# Patient Record
Sex: Female | Born: 1950 | Race: Black or African American | Hispanic: No | Smoking: Former smoker
Health system: Southern US, Community
[De-identification: ages and names within clinical notes are randomized; demographics above are authoritative.]

## PROBLEM LIST (undated history)

## (undated) DIAGNOSIS — M1990 Unspecified osteoarthritis, unspecified site: Secondary | ICD-10-CM

## (undated) DIAGNOSIS — K8020 Calculus of gallbladder without cholecystitis without obstruction: Secondary | ICD-10-CM

## (undated) DIAGNOSIS — H409 Unspecified glaucoma: Secondary | ICD-10-CM

## (undated) DIAGNOSIS — D509 Iron deficiency anemia, unspecified: Secondary | ICD-10-CM

## (undated) DIAGNOSIS — K648 Other hemorrhoids: Secondary | ICD-10-CM

## (undated) DIAGNOSIS — E039 Hypothyroidism, unspecified: Secondary | ICD-10-CM

## (undated) HISTORY — DX: Unspecified glaucoma: H40.9

## (undated) HISTORY — DX: Other hemorrhoids: K64.8

## (undated) HISTORY — DX: Hypothyroidism, unspecified: E03.9

## (undated) HISTORY — DX: Iron deficiency anemia, unspecified: D50.9

## (undated) HISTORY — PX: COLONOSCOPY: SHX174

## (undated) HISTORY — DX: Unspecified osteoarthritis, unspecified site: M19.90

## (undated) HISTORY — DX: Calculus of gallbladder without cholecystitis without obstruction: K80.20

---

## 1982-09-14 HISTORY — PX: CHOLECYSTECTOMY: SHX55

## 1987-09-15 HISTORY — PX: ABDOMINAL HYSTERECTOMY: SHX81

## 1996-09-14 HISTORY — PX: BREAST EXCISIONAL BIOPSY: SUR124

## 2008-09-14 HISTORY — PX: KNEE ARTHROSCOPY: SUR90

## 2009-06-25 HISTORY — PX: CERVICAL DISCECTOMY: SHX98

## 2014-07-15 ENCOUNTER — Emergency Department (HOSPITAL_COMMUNITY): Payer: Medicare HMO

## 2014-07-15 ENCOUNTER — Encounter (HOSPITAL_COMMUNITY): Payer: Self-pay | Admitting: *Deleted

## 2014-07-15 ENCOUNTER — Emergency Department (HOSPITAL_COMMUNITY)
Admission: EM | Admit: 2014-07-15 | Discharge: 2014-07-15 | Disposition: A | Discharge status: Home / Self Care | Payer: Medicare HMO | Attending: Emergency Medicine | Admitting: Emergency Medicine

## 2014-07-15 DIAGNOSIS — Y929 Unspecified place or not applicable: Secondary | ICD-10-CM | POA: Insufficient documentation

## 2014-07-15 DIAGNOSIS — S0990XA Unspecified injury of head, initial encounter: Secondary | ICD-10-CM | POA: Insufficient documentation

## 2014-07-15 DIAGNOSIS — Z791 Long term (current) use of non-steroidal anti-inflammatories (NSAID): Secondary | ICD-10-CM | POA: Insufficient documentation

## 2014-07-15 DIAGNOSIS — Y9389 Activity, other specified: Secondary | ICD-10-CM | POA: Insufficient documentation

## 2014-07-15 DIAGNOSIS — E079 Disorder of thyroid, unspecified: Secondary | ICD-10-CM | POA: Diagnosis not present

## 2014-07-15 DIAGNOSIS — W2209XA Striking against other stationary object, initial encounter: Secondary | ICD-10-CM | POA: Insufficient documentation

## 2014-07-15 DIAGNOSIS — Z79899 Other long term (current) drug therapy: Secondary | ICD-10-CM | POA: Insufficient documentation

## 2014-07-15 DIAGNOSIS — R51 Headache: Secondary | ICD-10-CM

## 2014-07-15 DIAGNOSIS — S199XXA Unspecified injury of neck, initial encounter: Secondary | ICD-10-CM | POA: Insufficient documentation

## 2014-07-15 DIAGNOSIS — R519 Headache, unspecified: Secondary | ICD-10-CM

## 2014-07-15 LAB — I-STAT CREATININE, ED: CREATININE: 1.1 mg/dL (ref 0.50–1.10)

## 2014-07-15 MED ORDER — OXYCODONE-ACETAMINOPHEN 5-325 MG PO TABS
2.0000 | ORAL_TABLET | Freq: Once | ORAL | Status: AC
  Administered 2014-07-15: 2 via ORAL
  Filled 2014-07-15: qty 2

## 2014-07-15 MED ORDER — HYDROCODONE-ACETAMINOPHEN 5-325 MG PO TABS: 2.0000 | ORAL_TABLET | ORAL | Status: DC | PRN

## 2014-07-15 NOTE — Discharge Instructions (Signed)
Your evaluated in the ED today for your headache after an injury. There is no acute or emergent cause for your headache. If you begin to experience any fevers, changes in vision, worsening headache, numbness or weakness please return to the ED for further evaluation. Take Vicodin as needed every 4 hours for pain

## 2014-07-15 NOTE — ED Provider Notes (Signed)
CSN: [id number]     Arrival date & time 07/15/14  1327 History   First MD Initiated Contact with Patient 07/15/14 1414     Chief Complaint  Patient presents with  . Headache     (Consider location/radiation/quality/duration/timing/severity/associated sxs/prior Treatment) HPI Destiny Page is a 63 y.o. female with a history of [place] who comes in for evaluation for a headache after injury. Patient states on Friday she was washing close at the [facility] [employer] when she bent over and raised up she hit her head on the above dryer door "very hard". She reports having extreme tenderness on the top of her head where she hit it as well as tenderness to her cervical spine. She denies any loss of consciousness or vomiting. She reports she has been nauseous intermittently since then. Denies any other numbness or weakness. Patient is not on any blood thinners or anticoagulation. She is concerned that she has a brain bleed and that the hit on her head has somehow compromised her discectomy surgery. No back pain red flags  Past Medical History  Diagnosis Date  . Thyroid disease    History reviewed. No pertinent past surgical history. History reviewed. No pertinent family history. History  Substance Use Topics  . Smoking status: Never Smoker   . Smokeless tobacco: Not on file  . Alcohol Use: No   OB History    No data available     Review of Systems    Allergies  Review of patient's allergies indicates no known allergies.  Home Medications   Prior to Admission medications   Medication Sig Start Date End Date Taking? Authorizing Provider  ibuprofen (ADVIL,MOTRIN) 200 MG tablet Take 200 mg by mouth every 6 (six) hours as needed.   Yes Historical Provider, MD  levothyroxine (SYNTHROID, LEVOTHROID) 112 MCG tablet Take 112 mcg by mouth daily before breakfast.   Yes Historical Provider, MD  meloxicam (MOBIC) 15 MG tablet Take 15 mg by mouth daily.   Yes  Historical Provider, MD  Multiple Vitamins-Minerals (MULTIVITAMIN WITH MINERALS) tablet Take 1 tablet by mouth daily.   Yes Historical Provider, MD  Naproxen Sodium (ALEVE) 220 MG CAPS Take 220 mg by mouth daily.   Yes Historical Provider, MD   BP 154/79 mmHg  Pulse 62  Temp(Src) 97.5 F (36.4 C) (Oral)  Resp 16  Ht 5\' 7"  (1.702 m)  Wt 170 lb (77.111 kg)  BMI 26.62 kg/m2  SpO2 100% Physical Exam  Constitutional: She is oriented to person, place, and time. She appears well-developed and well-nourished.  HENT:  Head: Normocephalic and atraumatic.  Mouth/Throat: Oropharynx is clear and moist.  Eyes: Conjunctivae are normal. Pupils are equal, round, and reactive to light. Right eye exhibits no discharge. Left eye exhibits no discharge. No scleral icterus.  Neck: Neck supple.  Cardiovascular: Normal rate, regular rhythm and normal heart sounds.   Pulmonary/Chest: Effort normal and breath sounds normal. No respiratory distress. She has no wheezes. She has no rales.  Abdominal: Soft. There is no tenderness.  Musculoskeletal: She exhibits no tenderness.  Tenderness to palpation of lower cervical spine. Patient exhibits full active range of motion of cervical, thoracic and lumbar spine. Moves all 4 extremities without difficulty  Neurological: She is alert and oriented to person, place, and time.  Cranial Nerves II-XII grossly intact. .  No focal neurodeficits patient is able to ambulate independently without any difficulty or appreciable ataxia  Skin: Skin is warm and dry. No  rash noted.  Psychiatric: She has a normal mood and affect.  Nursing note and vitals reviewed.   ED Course  Procedures (including critical care time) Labs Review Labs Reviewed - No data to display  Imaging Review No results found.   EKG Interpretation None     Meds given in ED:  Medications  oxyCODONE-acetaminophen (PERCOCET/ROXICET) 5-325 MG per tablet 2 tablet (2 tablets Oral Given 07/15/14 1651)     Discharge Medication List as of 07/15/2014  6:54 PM    START taking these medications   Details  HYDROcodone-acetaminophen (NORCO/VICODIN) 5-325 MG per tablet Take 2 tablets by mouth every 4 (four) hours as needed for moderate pain or severe pain., Starting 07/15/2014, Until Discontinued, Print       Filed Vitals:   07/15/14 1354 07/15/14 1900  BP: 154/79 122/83  Pulse: 62   Temp: 97.5 F (36.4 C)   TempSrc: Oral   Resp: 16 12  Height: 5\' 7"  (1.702 m)   Weight: 170 lb (77.111 kg)   SpO2: 100% 100%    MDM  Vitals stable - WNL -afebrile Pt resting comfortably in ED. pain managed in ED PE not concerning for any other acute or emergent pathology. No focal neurodeficits. Patient can ambulate independently without any apparent difficulty or ataxia Headache due to injury sustained not CNS, intracranial hemorrhage or other emergent cause of HA Labwork noncontributory Imaging--CT head and neck showed no acute intracranial abnormality Will DC with Vicodin for pain  Discussed f/u with PCP and return precautions, pt very amenable to plan.  Prior to patient discharge, I discussed and reviewed this case with Dr.Harrison    Final diagnoses:  Nonintractable headache, unspecified chronicity pattern, unspecified headache type        Destiny MottsBenjamin W Page Arrambide, PA-C 07/16/14 1118

## 2014-07-15 NOTE — ED Notes (Signed)
To ED for eval of head pain since hitting head on dryer door Friday. Pt denies vomiting. States she is nauseated. Ambulatory without difficulty. Denies LOC at time of incident. Taking Advil for 'head soreness'. Pt currently eating and drinking in triage. GCS 15. No neuro deficits noted. Pupils equal and reactive. Speech clear. Pt is not on blood thinners.

## 2014-07-15 NOTE — ED Notes (Signed)
Pt to CT at this time.

## 2015-02-28 ENCOUNTER — Other Ambulatory Visit: Payer: Self-pay

## 2015-02-28 DIAGNOSIS — Z1231 Encounter for screening mammogram for malignant neoplasm of breast: Secondary | ICD-10-CM

## 2015-03-08 ENCOUNTER — Ambulatory Visit
Admission: RE | Admit: 2015-03-08 | Discharge: 2015-03-08 | Disposition: A | Discharge status: Home / Self Care | Payer: Medicare HMO | Source: Ambulatory Visit

## 2015-03-08 DIAGNOSIS — Z1231 Encounter for screening mammogram for malignant neoplasm of breast: Secondary | ICD-10-CM

## 2015-03-14 ENCOUNTER — Encounter: Payer: Self-pay | Admitting: Internal Medicine

## 2015-05-21 ENCOUNTER — Encounter: Payer: Self-pay | Admitting: Internal Medicine

## 2015-05-21 ENCOUNTER — Other Ambulatory Visit (INDEPENDENT_AMBULATORY_CARE_PROVIDER_SITE_OTHER): Payer: Medicare HMO

## 2015-05-21 ENCOUNTER — Ambulatory Visit (INDEPENDENT_AMBULATORY_CARE_PROVIDER_SITE_OTHER): Payer: Medicare HMO | Admitting: Internal Medicine

## 2015-05-21 VITALS — BP 150/90 | HR 88 | Ht 64.17 in | Wt 173.5 lb

## 2015-05-21 DIAGNOSIS — K5900 Constipation, unspecified: Secondary | ICD-10-CM | POA: Diagnosis not present

## 2015-05-21 DIAGNOSIS — R140 Abdominal distension (gaseous): Secondary | ICD-10-CM | POA: Diagnosis not present

## 2015-05-21 DIAGNOSIS — D509 Iron deficiency anemia, unspecified: Secondary | ICD-10-CM

## 2015-05-21 DIAGNOSIS — R1013 Epigastric pain: Secondary | ICD-10-CM | POA: Diagnosis not present

## 2015-05-21 DIAGNOSIS — Z1211 Encounter for screening for malignant neoplasm of colon: Secondary | ICD-10-CM

## 2015-05-21 LAB — CBC WITH DIFFERENTIAL/PLATELET
BASOS ABS: 0 10*3/uL (ref 0.0–0.1)
Basophils Relative: 0.4 % (ref 0.0–3.0)
Eosinophils Absolute: 0.2 10*3/uL (ref 0.0–0.7)
Eosinophils Relative: 3.2 % (ref 0.0–5.0)
HEMATOCRIT: 36.2 % (ref 36.0–46.0)
HEMOGLOBIN: 11.4 g/dL — AB (ref 12.0–15.0)
LYMPHS PCT: 37 % (ref 12.0–46.0)
Lymphs Abs: 2.5 10*3/uL (ref 0.7–4.0)
MCHC: 31.5 g/dL (ref 30.0–36.0)
MCV: 71 fl — ABNORMAL LOW (ref 78.0–100.0)
MONO ABS: 0.6 10*3/uL (ref 0.1–1.0)
Monocytes Relative: 8.7 % (ref 3.0–12.0)
Neutro Abs: 3.5 10*3/uL (ref 1.4–7.7)
Neutrophils Relative %: 50.7 % (ref 43.0–77.0)
Platelets: 385 10*3/uL (ref 150.0–400.0)
RBC: 5.09 Mil/uL (ref 3.87–5.11)
RDW: 16.7 % — AB (ref 11.5–15.5)
WBC: 6.8 10*3/uL (ref 4.0–10.5)

## 2015-05-21 LAB — H. PYLORI ANTIBODY, IGG: H PYLORI IGG: NEGATIVE

## 2015-05-21 LAB — FERRITIN: Ferritin: 151.6 ng/mL (ref 10.0–291.0)

## 2015-05-21 LAB — IBC PANEL
Iron: 75 ug/dL (ref 42–145)
SATURATION RATIOS: 21 % (ref 20.0–50.0)
TRANSFERRIN: 255 mg/dL (ref 212.0–360.0)

## 2015-05-21 MED ORDER — INTEGRA PLUS PO CAPS: 1.0000 | ORAL_CAPSULE | Freq: Every day | ORAL | Status: DC

## 2015-05-21 MED ORDER — POLYETHYLENE GLYCOL 3350 17 GM/SCOOP PO POWD: 17.0000 g | Freq: Every day | ORAL | Status: DC

## 2015-05-21 NOTE — Progress Notes (Signed)
Patient ID: Symphanie Cederberg, female   DOB: 1951/02/21, 64 y.o.   MRN: [id number] HPI: Shawntrice Salle is a 64 year old female with past medical history of long-standing iron deficiency anemia, gallstones status post cholecystectomy, hypothyroidism who is seen in consultation at the request of Harrietta Guardian, FNP to consider repeat colonoscopy. She is here alone today. She reports overall she feels well. She's had 2 previous colonoscopies performed in [place] and was told she was due repeat colonoscopy in 2015. She moved to [place] upon retirement in 2015. From a GI perspective she reports she's been dealing with constipation over the last 6-12 months. She has been using laxative on occasion, specifically Dulcolax maybe once per month. When she has not had a bowel movement she feels bloating and fullness. She has also noted burning in her mid abdomen not necessarily related to eating. This seems to get better if she drinks hot tea. She denies heartburn, dysphagia or odynophagia. She has recently restarted ferrous sulfate and has noticed the abdominal burning since this time. She's having a bowel movement about every other day but would like to have a bowel movement daily. She denies family history of colon cancer, IBD and sickle cell. She reports her iron deficiency anemia dates back as long she can remember and she has had to periodically take iron throughout her life. She was initially referred to GI to evaluate iron deficiency anemia. She brings a copy of both colonoscopy reports today.  Past Medical History  Diagnosis Date  . Hypothyroidism   . Gallstones   . Anemia   . Arthritis     Past Surgical History  Procedure Laterality Date  . Cholecystectomy  1984  . Abdominal hysterectomy  1989  . Knee arthroscopy Bilateral 2010    Feb and April 2010  . Cervical discectomy  Jun 25, 2009    Outpatient Prescriptions Prior to Visit  Medication Sig Dispense Refill  . Multiple Vitamins-Minerals  (MULTIVITAMIN WITH MINERALS) tablet Take 1 tablet by mouth daily.    . Naproxen Sodium (ALEVE) 220 MG CAPS Take 220 mg by mouth as needed.     Marland Kitchen HYDROcodone-acetaminophen (NORCO/VICODIN) 5-325 MG per tablet Take 2 tablets by mouth every 4 (four) hours as needed for moderate pain or severe pain. 10 tablet 0  . ibuprofen (ADVIL,MOTRIN) 200 MG tablet Take 200 mg by mouth every 6 (six) hours as needed.    Marland Kitchen levothyroxine (SYNTHROID, LEVOTHROID) 112 MCG tablet Take 112 mcg by mouth daily before breakfast.    . meloxicam (MOBIC) 15 MG tablet Take 15 mg by mouth daily.     No facility-administered medications prior to visit.    No Known Allergies  Family History  Problem Relation Age of Onset  . Hypertension Mother   . Hypertension Father   . Hypertension Brother     X 3  . Hypertension Sister     x 3  . Stroke Mother   . Stroke Sister     x 2  . Stroke Father   . Stroke Brother   . Diabetes Father   . Diabetes Brother   . Diabetes Sister   . Throat cancer Maternal Aunt     Social History  Substance Use Topics  . Smoking status: Former Smoker -- 36 years    Types: Cigarettes    Quit date: 09/14/2008  . Smokeless tobacco: Never Used  . Alcohol Use: No    ROS: As per history of present illness, otherwise negative  BP 150/90  mmHg  Pulse 88  Ht 5' 4.17" (1.63 m)  Wt 173 lb 8 oz (78.699 kg)  BMI 29.62 kg/m2 Constitutional: Well-developed and well-nourished. No distress. HEENT: Normocephalic and atraumatic. Oropharynx is clear and moist. No oropharyngeal exudate. Conjunctivae are normal.  No scleral icterus. Neck: Neck supple. Trachea midline. Cardiovascular: Normal rate, regular rhythm and intact distal pulses. No M/R/G Pulmonary/chest: Effort normal and breath sounds normal. No wheezing, rales or rhonchi. Abdominal: Soft, nontender, mildly distended. Bowel sounds active throughout. Extremities: no clubbing, cyanosis, or edema Lymphadenopathy: No cervical adenopathy  noted. Neurological: Alert and oriented to person place and time. Skin: Skin is warm and dry. No rashes noted. Psychiatric: Normal mood and affect. Behavior is normal.  RELEVANT LABS AND IMAGING: CBC    Component Value Date/Time   WBC 6.8 05/21/2015 1535   RBC 5.09 05/21/2015 1535   HGB 11.4* 05/21/2015 1535   HCT 36.2 05/21/2015 1535   PLT 385.0 05/21/2015 1535   MCV 71.0* 05/21/2015 1535   MCHC 31.5 05/21/2015 1535   RDW 16.7* 05/21/2015 1535   LYMPHSABS 2.5 05/21/2015 1535   MONOABS 0.6 05/21/2015 1535   EOSABS 0.2 05/21/2015 1535   BASOSABS 0.0 05/21/2015 1535   Iron/TIBC/Ferritin/ %Sat    Component Value Date/Time   IRON 75 05/21/2015 1535   FERRITIN 151.6 05/21/2015 1535   IRONPCTSAT 21.0 05/21/2015 1535   GI records reviewed and discussed with the patient Colonoscopy dated 04/19/2002 - performed by Dr. Enzo Montgomery -- internal hemorrhoids, normal colon. Visualization good. Exam to the cecum Colonoscopy dated 05/21/2009 -- performed by Dr. Enzo Montgomery -- internal hemorrhoids, no evidence of colonic polyp or neoplasm   ASSESSMENT/PLAN:  64 year old female with past medical history of long-standing iron deficiency anemia, gallstones status post cholecystectomy, hypothyroidism who is seen in consultation at the request of Harrietta Guardian, FNP to consider repeat colonoscopy.   1. CRC screening -- she is average risk and we discussed current nationally colon cancer screening guidelines. She has had 2 normal colonoscopies to the cecum last was in 2010. Current guidelines support repeat screening at 10 year intervals. This is news to her. I have recommended repeat colonoscopy in September 2020  2. History of iron deficiency anemia -- repeat CBC and iron studies today. She has started ferrous sulfate and noted constipation and abdominal burning. This may be secondary to her iron supplement. I recommended changing iron supplement to Integra 1 tablet daily rather than ferrous  sulfate. Iron studies show normal iron levels today though she does have a mild microcytic anemia. I wonder if she has a thalassemia variant because iron studies are not low today but she remains microcytic. Consider hemoglobin electrophoresis at follow-up  3. Constipation -- mild and associated with bloating. Trial of MiraLAX 17 g daily  4. Abdominal burning pain -- mild possibly related to iron. Check H. pylori antibody. Treat if positive.  Follow-up in 3 months to reassess symptoms. If abdominal discomfort continues consider upper endoscopy versus empiric trial of PPI    ZO:XWRUE Darleen Crocker, Fnp [address] [place], [place] [address]

## 2015-05-21 NOTE — Patient Instructions (Signed)
We have sent the following medications to your pharmacy for you to pick up at your convenience: Integra 1 capsule daily (in place of over the counter ferrous sulfate) Miralax 17 grams-Dissolved in at least 8 ounces water/juice once daily  Your physician has requested that you go to the basement for the following lab work before leaving today: CBC, IBC, Ferritin, H pylori antibody  Please discontinue stool softeners.  You have been scheduled for a follow up appointment with Dr Rhea Belton on Monday, 07/29/15 @ 1:30 pm.  CC:Dr Fredric Mare

## 2015-05-22 ENCOUNTER — Other Ambulatory Visit: Payer: Self-pay

## 2015-05-22 DIAGNOSIS — D509 Iron deficiency anemia, unspecified: Secondary | ICD-10-CM

## 2015-06-02 ENCOUNTER — Encounter (HOSPITAL_COMMUNITY): Payer: Self-pay | Admitting: *Deleted

## 2015-06-02 ENCOUNTER — Emergency Department (HOSPITAL_COMMUNITY): Payer: Medicare HMO

## 2015-06-02 ENCOUNTER — Emergency Department (HOSPITAL_COMMUNITY)
Admission: EM | Admit: 2015-06-02 | Discharge: 2015-06-02 | Disposition: A | Discharge status: Home / Self Care | Payer: Medicare HMO | Attending: Emergency Medicine | Admitting: Emergency Medicine

## 2015-06-02 DIAGNOSIS — Z87891 Personal history of nicotine dependence: Secondary | ICD-10-CM | POA: Diagnosis not present

## 2015-06-02 DIAGNOSIS — E039 Hypothyroidism, unspecified: Secondary | ICD-10-CM | POA: Insufficient documentation

## 2015-06-02 DIAGNOSIS — M79671 Pain in right foot: Secondary | ICD-10-CM | POA: Insufficient documentation

## 2015-06-02 DIAGNOSIS — M1990 Unspecified osteoarthritis, unspecified site: Secondary | ICD-10-CM | POA: Insufficient documentation

## 2015-06-02 DIAGNOSIS — Z8719 Personal history of other diseases of the digestive system: Secondary | ICD-10-CM | POA: Diagnosis not present

## 2015-06-02 DIAGNOSIS — D649 Anemia, unspecified: Secondary | ICD-10-CM | POA: Diagnosis not present

## 2015-06-02 DIAGNOSIS — Z791 Long term (current) use of non-steroidal anti-inflammatories (NSAID): Secondary | ICD-10-CM | POA: Insufficient documentation

## 2015-06-02 DIAGNOSIS — M25474 Effusion, right foot: Secondary | ICD-10-CM | POA: Diagnosis not present

## 2015-06-02 DIAGNOSIS — Z79899 Other long term (current) drug therapy: Secondary | ICD-10-CM | POA: Insufficient documentation

## 2015-06-02 MED ORDER — TRAMADOL HCL 50 MG PO TABS
50.0000 mg | ORAL_TABLET | Freq: Once | ORAL | Status: AC
  Administered 2015-06-02: 50 mg via ORAL
  Filled 2015-06-02: qty 1

## 2015-06-02 MED ORDER — TRAMADOL HCL 50 MG PO TABS: 50.0000 mg | ORAL_TABLET | Freq: Four times a day (QID) | ORAL | Status: DC | PRN

## 2015-06-02 NOTE — ED Notes (Signed)
Declined W/C at D/C and was escorted to lobby by RN. 

## 2015-06-02 NOTE — ED Notes (Signed)
Pt reports RT foot pain stared one week ago 05-26-15 . Unknown cause of pain. Pt denies any injury. Pt reports OTC not helping .

## 2015-06-02 NOTE — ED Provider Notes (Signed)
CSN: [id number]     Arrival date & time 06/02/15  0910 History  This chart was scribed for Renne Crigler, PA-C, working with Marily Memos, MD by Elon Spanner, ED Scribe. This patient was seen in room TR07C/TR07C and the patient's care was started at 9:56 AM.   Chief Complaint  Patient presents with  . Foot Pain   The history is provided by the patient. No language interpreter was used.   HPI Comments: Destiny Page is a 64 y.o. female who presents to the Emergency Department complaining of moderate, gradually worsening right foot pain and swelling onset 1 week ago, worse with use, improved with rest, not improved by NSAID/epsom salt/OTC topical cream.  The pain onset after wearing heels to church.  She denies hx of arthritis including gout. She denies knee pain, calf pain.    Past Medical History  Diagnosis Date  . Hypothyroidism   . Gallstones   . Anemia   . Arthritis    Past Surgical History  Procedure Laterality Date  . Cholecystectomy  1984  . Abdominal hysterectomy  1989  . Knee arthroscopy Bilateral 2010    Feb and April 2010  . Cervical discectomy  Jun 25, 2009   Family History  Problem Relation Age of Onset  . Hypertension Mother   . Hypertension Father   . Hypertension Brother     X 3  . Hypertension Sister     x 3  . Stroke Mother   . Stroke Sister     x 2  . Stroke Father   . Stroke Brother   . Diabetes Father   . Diabetes Brother   . Diabetes Sister   . Throat cancer Maternal Aunt    Social History  Substance Use Topics  . Smoking status: Former Smoker -- 36 years    Types: Cigarettes    Quit date: 09/14/2008  . Smokeless tobacco: Never Used  . Alcohol Use: No   OB History    No data available     Review of Systems  Constitutional: Negative for fever and activity change.  Musculoskeletal: Positive for joint swelling, arthralgias and gait problem. Negative for back pain and neck pain.  Skin: Negative for wound.  Neurological: Negative for  weakness and numbness.      Allergies  Review of patient's allergies indicates no known allergies.  Home Medications   Prior to Admission medications   Medication Sig Start Date End Date Taking? Authorizing Provider  cyclobenzaprine (FLEXERIL) 10 MG tablet Take 10 mg by mouth as needed for muscle spasms.    Historical Provider, MD  diclofenac (VOLTAREN) 75 MG EC tablet Take 75 mg by mouth 2 (two) times daily.    Historical Provider, MD  FeFum-FePoly-FA-B Cmp-C-Biot (INTEGRA PLUS) CAPS Take 1 capsule by mouth daily. 05/21/15   Beverley Fiedler, MD  levothyroxine (SYNTHROID, LEVOTHROID) 125 MCG tablet Take 125 mcg by mouth daily before breakfast.    Historical Provider, MD  Multiple Vitamins-Minerals (MULTIVITAMIN WITH MINERALS) tablet Take 1 tablet by mouth daily.    Historical Provider, MD  Naproxen Sodium (ALEVE) 220 MG CAPS Take 220 mg by mouth as needed.     Historical Provider, MD  polyethylene glycol powder (GLYCOLAX/MIRALAX) powder Take 17 g by mouth daily. 05/21/15   Beverley Fiedler, MD   There were no vitals taken for this visit. Physical Exam  Constitutional: She is oriented to person, place, and time. She appears well-developed and well-nourished. No distress.  HENT:  Head: Normocephalic and atraumatic.  Eyes: Conjunctivae and EOM are normal. Pupils are equal, round, and reactive to light.  Neck: Normal range of motion. Neck supple. No tracheal deviation present.  Cardiovascular: Normal rate.  Exam reveals no decreased pulses.   Pulses:      Dorsalis pedis pulses are 2+ on the right side.       Posterior tibial pulses are 2+ on the right side.  Pulmonary/Chest: Effort normal. No respiratory distress.  Musculoskeletal: She exhibits tenderness. She exhibits no edema.       Right ankle: Normal.       Right foot: There is decreased range of motion (with inversion/eversion), tenderness and bony tenderness. There is no swelling.  Neurological: She is alert and oriented to person, place,  and time. No sensory deficit.  Motor, sensation, and vascular distal to the injury is fully intact.   Skin: Skin is warm and dry.  Psychiatric: She has a normal mood and affect. Her behavior is normal.  Nursing note and vitals reviewed.   ED Course  Procedures (including critical care time)  DIAGNOSTIC STUDIES: Oxygen Saturation is 99% on RA, normal by my interpretation.    COORDINATION OF CARE:  9:58 AM Will order imaging of right foot.  Patient should f/u with provided orthopaedic referral.  Patient acknowledges and agrees with plan.   Labs Review Labs Reviewed - No data to display  Imaging Review Dg Foot Complete Right  06/02/2015   CLINICAL DATA:  Right foot pain-top of her right foot x 1 week. Pt denies any injury.  EXAM: RIGHT FOOT COMPLETE - 3+ VIEW  COMPARISON:  None.  FINDINGS: There is no evidence of fracture or dislocation. Small calcaneal spurs. There is no evidence of arthropathy or other focal bone abnormality. Soft tissues are unremarkable.  IMPRESSION: Negative.   Electronically Signed   By: Corlis Leak M.D.   On: 06/02/2015 10:48   I have personally reviewed and evaluated these images and lab results as part of my medical decision-making.   EKG Interpretation None       Vital signs reviewed and are as follows: Filed Vitals:   06/02/15 0942  BP: 116/68  Pulse: 87  Temp: 98 F (36.7 C)  Resp: 18   Patient informed of negative x-ray results. She will continue her home NSAIDs. She has a cane at home which she will use for stability purposes. Orthopedic referral given and encouraged if she continues to have significant pain in one week. Tramadol temporarily for pain.  Patient counseled on use of narcotic pain medications. Counseled not to combine these medications with others containing tylenol. Urged not to drink alcohol, drive, or perform any other activities that requires focus while taking these medications. The patient verbalizes understanding and agrees with  the plan.   MDM   Final diagnoses:  Foot pain, right    Patient with foot pain, unclear etiology. Imaging is negative. Suspect arthritis. Treatment as above. Foot is neurovascularly intact. Orthopedic referral given.  I personally performed the services described in this documentation, which was scribed in my presence. The recorded information has been reviewed and is accurate.    Renne Crigler, PA-C 06/02/15 1107  Marily Memos, MD 06/02/15 [phone]

## 2015-06-02 NOTE — Discharge Instructions (Signed)
Please read and follow all provided instructions.  Your diagnoses today include:  1. Foot pain, right     Tests performed today include:  An x-ray of the affected area - does NOT show any broken bones  Vital signs. See below for your results today.   Medications prescribed:   Tramadol - narcotic-like pain medication  DO NOT drive or perform any activities that require you to be awake and alert because this medicine can make you drowsy.   Take any prescribed medications only as directed.  Home care instructions:   Follow any educational materials contained in this packet  Follow R.I.C.E. Protocol:  R - rest your injury   I  - use ice on injury without applying directly to skin  C - compress injury with bandage or splint  E - elevate the injury as much as possible  Follow-up instructions: Please follow-up with your primary care provider or the provided orthopedic physician (bone specialist) if you continue to have significant pain in 1 week. In this case you may have a more severe injury that requires further care.   Return instructions:   Please return if your toes or feet are numb or tingling, appear gray or blue, or you have severe pain (also elevate the leg and loosen splint or wrap if you were given one)  Please return to the Emergency Department if you experience worsening symptoms.   Please return if you have any other emergent concerns.  Additional Information:  Your vital signs today were: BP 116/68 mmHg   Pulse 87   Temp(Src) 98 F (36.7 C) (Oral)   Resp 18   Ht  (1.626 m)   Wt 173 lb (78.472 kg)   BMI 29.68 kg/m2   SpO2 99% If your blood pressure (BP) was elevated above 135/85 this visit, please have this repeated by your doctor within one month. --------------

## 2015-06-02 NOTE — ED Notes (Signed)
PT refused ice to foot because she has already use ice for pain . Pt reports ice did not help pain.

## 2015-07-01 ENCOUNTER — Other Ambulatory Visit: Payer: Self-pay | Admitting: Orthopaedic Surgery

## 2015-07-01 ENCOUNTER — Other Ambulatory Visit: Payer: Medicare HMO

## 2015-07-01 ENCOUNTER — Ambulatory Visit
Admission: RE | Admit: 2015-07-01 | Discharge: 2015-07-01 | Disposition: A | Discharge status: Home / Self Care | Payer: Medicare HMO | Source: Ambulatory Visit | Attending: Orthopaedic Surgery | Admitting: Orthopaedic Surgery

## 2015-07-01 DIAGNOSIS — M79671 Pain in right foot: Secondary | ICD-10-CM

## 2015-07-09 ENCOUNTER — Encounter: Payer: Self-pay | Admitting: *Deleted

## 2015-07-29 ENCOUNTER — Ambulatory Visit: Payer: Medicare HMO | Admitting: Internal Medicine

## 2015-09-30 ENCOUNTER — Other Ambulatory Visit (INDEPENDENT_AMBULATORY_CARE_PROVIDER_SITE_OTHER): Payer: Medicare HMO

## 2015-09-30 ENCOUNTER — Ambulatory Visit (INDEPENDENT_AMBULATORY_CARE_PROVIDER_SITE_OTHER): Payer: Medicare HMO | Admitting: Internal Medicine

## 2015-09-30 ENCOUNTER — Encounter: Payer: Self-pay | Admitting: Internal Medicine

## 2015-09-30 VITALS — BP 128/72 | HR 76 | Ht 64.0 in | Wt 172.0 lb

## 2015-09-30 DIAGNOSIS — Z1211 Encounter for screening for malignant neoplasm of colon: Secondary | ICD-10-CM | POA: Diagnosis not present

## 2015-09-30 DIAGNOSIS — R143 Flatulence: Secondary | ICD-10-CM | POA: Diagnosis not present

## 2015-09-30 DIAGNOSIS — K5900 Constipation, unspecified: Secondary | ICD-10-CM | POA: Diagnosis not present

## 2015-09-30 DIAGNOSIS — D509 Iron deficiency anemia, unspecified: Secondary | ICD-10-CM | POA: Diagnosis not present

## 2015-09-30 DIAGNOSIS — K5909 Other constipation: Secondary | ICD-10-CM

## 2015-09-30 LAB — CBC WITH DIFFERENTIAL/PLATELET
BASOS ABS: 0 10*3/uL (ref 0.0–0.1)
Basophils Relative: 0.4 % (ref 0.0–3.0)
Eosinophils Absolute: 0.2 10*3/uL (ref 0.0–0.7)
Eosinophils Relative: 2.7 % (ref 0.0–5.0)
HEMATOCRIT: 33.4 % — AB (ref 36.0–46.0)
Hemoglobin: 10.5 g/dL — ABNORMAL LOW (ref 12.0–15.0)
Lymphocytes Relative: 40.7 % (ref 12.0–46.0)
Lymphs Abs: 2.7 10*3/uL (ref 0.7–4.0)
MCHC: 31.6 g/dL (ref 30.0–36.0)
MCV: 70.2 fl — AB (ref 78.0–100.0)
Monocytes Absolute: 0.5 10*3/uL (ref 0.1–1.0)
Monocytes Relative: 8.4 % (ref 3.0–12.0)
NEUTROS ABS: 3.1 10*3/uL (ref 1.4–7.7)
Neutrophils Relative %: 47.8 % (ref 43.0–77.0)
Platelets: 355 10*3/uL (ref 150.0–400.0)
RBC: 4.75 Mil/uL (ref 3.87–5.11)
RDW: 15.9 % — ABNORMAL HIGH (ref 11.5–15.5)
WBC: 6.6 10*3/uL (ref 4.0–10.5)

## 2015-09-30 LAB — IBC PANEL
Iron: 57 ug/dL (ref 42–145)
SATURATION RATIOS: 16.5 % — AB (ref 20.0–50.0)
Transferrin: 247 mg/dL (ref 212.0–360.0)

## 2015-09-30 MED ORDER — POLYSACCHARIDE IRON COMPLEX 150 MG PO CAPS: 150.0000 mg | ORAL_CAPSULE | Freq: Two times a day (BID) | ORAL | Status: AC

## 2015-09-30 MED ORDER — POLYETHYLENE GLYCOL 3350 17 GM/SCOOP PO POWD: 17.0000 g | Freq: Every day | ORAL | Status: AC

## 2015-09-30 NOTE — Progress Notes (Signed)
Subjective:    Patient ID: Destiny Page, female    DOB: 01/04/1951, 65 y.o.   MRN: [id number]  HPI Destiny Page is a  65 year old female with long-standing history of iron deficiency anemia, history of chronic constipation , hypothyroidism who is here for follow-up. She was initially seen on 05/21/2015. At that time we discussed her iron deficiency anemia as well as constipation. 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 She's having 1-2 bowel movements per day hich are soft and formed. She is very happy with the result. She's had less abdominal bloating and lower discomfort with more regular bowel movements. She does report frequent gas/ flatulence when having bowel movements or even urinating. This is somewhat bothersome to her. She denies abdominal pain, nausea or vomiting. Denies dysphagia or odynophagia. She has taken Integra for iron supplementation and tolerated this better than ferrous sulfate. However it is expensive and she wonders if there is another option. No fevers or chills. She is planning on 10/16/2015 to drop beef and pork from her diet in order to be more healthy. She is worried about "problems inside my colon" and is interested in repeat colorectal cancer screening.   Review of Systems  as per history of present illness, otherwise negative  Current Medications, Allergies, Past Medical History, Past Surgical History, Family History and Social History were reviewed in [facility] electronic medical record.     Objective:   Physical Exam BP 128/72 mmHg  Pulse 76  Ht 5\' 4"  (1.626 m)  Wt 172 lb (78.019 kg)  BMI 29.51 kg/m2 Constitutional: Well-developed and well-nourished. No distress. HEENT: Normocephalic and atraumatic.Marland Kitchen. Conjunctivae are normal.  No scleral icterus. Neck: Neck supple. Trachea midline. Cardiovascular: Normal rate, regular rhythm and intact distal pulses. No M/R/G Pulmonary/chest: Effort  normal and breath sounds normal. No wheezing, rales or rhonchi. Abdominal: Soft, nontender, nondistended. Bowel sounds active throughout. Extremities: no clubbing, cyanosis, or edema Neurological: Alert and oriented to person place and time. Skin: Skin is warm and dry.  Psychiatric: Normal mood and affect. Behavior is normal.  Colonoscopy dated 04/19/2002 - performed by Dr. Enzo Montgomeryobert Harooni -- internal hemorrhoids, normal colon. Visualization good. Exam to the cecum Colonoscopy dated 05/21/2009 -- performed by Dr. Enzo Montgomeryobert Harooni -- internal hemorrhoids, no evidence of colonic polyp or neoplasm  CBC    Component Value Date/Time   WBC 6.8 05/21/2015 1535   RBC 5.09 05/21/2015 1535   HGB 11.4* 05/21/2015 1535   HCT 36.2 05/21/2015 1535   PLT 385.0 05/21/2015 1535   MCV 71.0* 05/21/2015 1535   MCHC 31.5 05/21/2015 1535   RDW 16.7* 05/21/2015 1535   LYMPHSABS 2.5 05/21/2015 1535   MONOABS 0.6 05/21/2015 1535   EOSABS 0.2 05/21/2015 1535   BASOSABS 0.0 05/21/2015 1535   Iron/TIBC/Ferritin/ %Sat    Component Value Date/Time   IRON 75 05/21/2015 1535   FERRITIN 151.6 05/21/2015 1535   IRONPCTSAT 21.0 05/21/2015 1535      Assessment & Plan:   65 year old female with long-standing history of iron deficiency anemia, history of chronic constipation , hypothyroidism who is here for follow-up.   1.  IDA --  She's been on Integra and iron studies were within normal limits when checked in September. Recheck today for iron, CBC and also hemoglobin electrophoresis. We'll try to find another more for double iron preparation for her.   2. Chronic constipation --  Improved with MiraLAX. Continue 17 g daily   3.  Flatulence --  Possibly worsened by MiraLAX. It has helped problem #2 and so I recommend continuing MiraLAX daily. Trial of align 1 capsule daily as a probiotic to see if this improves flatulence. Despite flatulence, bloating improved with treatment for constipation   4. CRC screening --   She is very much in favor of repeat colorectal cancer screening at this time. Her prior GI doctor recommended every 5 year screening interval. She is average risk without family history of colon cancer. On 2 colonoscopy she's had no evidence for colonic polyp. I discussed current guidelines for 10 year surveillance but this makes her very uncomfortable. Her last exam was nearly 7 years ago. After discussion of the risks, benefit, alternative and her preference, we will repeat colorectal cancer screening at this time.  1 yr follow-up, sooner if needed

## 2015-09-30 NOTE — Patient Instructions (Signed)
You have been scheduled for a colonoscopy. Please follow written instructions given to you at your visit today.  Please pick up your prep supplies at the pharmacy within the next 1-3 days. If you use inhalers (even only as needed), please bring them with you on the day of your procedure. Your physician has requested that you go to www.startemmi.com and enter the access code given to you at your visit today. This web site gives a general overview about your procedure. However, you should still follow specific instructions given to you by our office regarding your preparation for the procedure.  We have sent the following medications to your pharmacy for you to pick up at your convenience: Nu Iron 150 mg twice daily Miralax 17 grams daily  Please follow up with Dr Rhea BeltonPyrtle in 6 months.  Please purchase the following medications over the counter and take as directed: Align- Take 1 capsule once daily

## 2015-10-02 ENCOUNTER — Other Ambulatory Visit: Payer: Self-pay

## 2015-10-02 DIAGNOSIS — D509 Iron deficiency anemia, unspecified: Secondary | ICD-10-CM

## 2015-10-02 LAB — HEMOGLOBINOPATHY EVALUATION
Hemoglobin Other: 0 %
Hgb A2 Quant: 2.4 % (ref 2.2–3.2)
Hgb A: 97.6 % (ref 96.8–97.8)
Hgb F Quant: 0 % (ref 0.0–2.0)
Hgb S Quant: 0 %

## 2015-10-02 MED ORDER — NA SULFATE-K SULFATE-MG SULF 17.5-3.13-1.6 GM/177ML PO SOLN: ORAL | Status: DC

## 2015-11-14 ENCOUNTER — Ambulatory Visit (AMBULATORY_SURGERY_CENTER): Payer: Medicare HMO | Admitting: Internal Medicine

## 2015-11-14 ENCOUNTER — Encounter: Payer: Self-pay | Admitting: Internal Medicine

## 2015-11-14 VITALS — BP 146/81 | HR 79 | Temp 97.6°F | Resp 16 | Ht 64.0 in | Wt 172.0 lb

## 2015-11-14 DIAGNOSIS — D509 Iron deficiency anemia, unspecified: Secondary | ICD-10-CM

## 2015-11-14 DIAGNOSIS — Z1211 Encounter for screening for malignant neoplasm of colon: Secondary | ICD-10-CM

## 2015-11-14 DIAGNOSIS — Z862 Personal history of diseases of the blood and blood-forming organs and certain disorders involving the immune mechanism: Secondary | ICD-10-CM

## 2015-11-14 MED ORDER — SODIUM CHLORIDE 0.9 % IV SOLN: 500.0000 mL | INTRAVENOUS | Status: DC

## 2015-11-14 NOTE — Op Note (Signed)
[facility] [address] [place], [address]   COLONOSCOPY PROCEDURE REPORT  PATIENT: Emalyn, Schou  MR#: [id number] BIRTHDATE: 1951/06/18 , 64  yrs. old GENDER: female ENDOSCOPIST: Beverley Fiedler, MD PROCEDURE DATE:  11/14/2015 PROCEDURE:   Colonoscopy, screening First Screening Colonoscopy - Avg.  risk and is 50 yrs.  old or older - No.  Prior Negative Screening - Now for repeat screening. Less than 10 yrs Prior Negative Screening - Now for repeat screening.  Other: See Comments  History of Adenoma - Now for follow-up colonoscopy & has been > or = to 3 yrs.  N/A  Polyps removed today? No Recommend repeat exam, <10 yrs? No ASA CLASS:   Class II INDICATIONS:Screening for colonic neoplasia and history of IDA. MEDICATIONS: Monitored anesthesia care and Propofol 200 mg IV  DESCRIPTION OF PROCEDURE:   After the risks benefits and alternatives of the procedure were thoroughly explained, informed consent was obtained.  The digital rectal exam revealed no abnormalities of the rectum.   The LB [id number]  endoscope was introduced through the anus and advanced to the cecum, which was identified by both the appendix and ileocecal valve. No adverse events experienced.   The quality of the prep was good.  (Suprep was used)  The instrument was then slowly withdrawn as the colon was fully examined. Estimated blood loss is zero unless otherwise noted in this procedure report.      COLON FINDINGS: A normal appearing cecum, ileocecal valve, and appendiceal orifice were identified.  the ascending, transverse, descending, sigmoid colon, and rectum appeared unremarkable. Retroflexed views revealed no abnormalities. The time to cecum = 4.1 Withdrawal time = 9.4   The scope was withdrawn and the procedure completed.  COMPLICATIONS: There were no complications.  ENDOSCOPIC IMPRESSION: Normal colonoscopy  RECOMMENDATIONS: You should continue to follow colorectal  cancer screening guidelines for "routine risk" patients with a repeat colonoscopy in 10 years.   eSigned:  Beverley Fiedler, MD 11/14/2015 8:34 AM   cc: the patient, Harrietta Guardian, FNP

## 2015-11-14 NOTE — Patient Instructions (Signed)
YOU HAD AN ENDOSCOPIC PROCEDURE TODAY AT THE [facility]:   Refer to the procedure report that was given to you for any specific questions about what was found during the examination.  If the procedure report does not answer your questions, please call your gastroenterologist to clarify.  If you requested that your care partner not be given the details of your procedure findings, then the procedure report has been included in a sealed envelope for you to review at your convenience later.  YOU SHOULD EXPECT: Some feelings of bloating in the abdomen. Passage of more gas than usual.  Walking can help get rid of the air that was put into your GI tract during the procedure and reduce the bloating. If you had a lower endoscopy (such as a colonoscopy or flexible sigmoidoscopy) you may notice spotting of blood in your stool or on the toilet paper. If you underwent a bowel prep for your procedure, you may not have a normal bowel movement for a few days.  Please Note:  You might notice some irritation and congestion in your nose or some drainage.  This is from the oxygen used during your procedure.  There is no need for concern and it should clear up in a day or so.  SYMPTOMS TO REPORT IMMEDIATELY:   Following lower endoscopy (colonoscopy or flexible sigmoidoscopy):  Excessive amounts of blood in the stool  Significant tenderness or worsening of abdominal pains  Swelling of the abdomen that is new, acute  Fever of 100F or higher   For urgent or emergent issues, a gastroenterologist can be reached at any hour by calling [phone].   DIET: Your first meal following the procedure should be a small meal and then it is ok to progress to your normal diet. Heavy or fried foods are harder to digest and may make you feel nauseous or bloated.  Likewise, meals heavy in dairy and vegetables can increase bloating.  Drink plenty of fluids but you should avoid alcoholic beverages for 24  hours.  ACTIVITY:  You should plan to take it easy for the rest of today and you should NOT DRIVE or use heavy machinery until tomorrow (because of the sedation medicines used during the test).    FOLLOW UP: Our staff will call the number listed on your records the next business day following your procedure to check on you and address any questions or concerns that you may have regarding the information given to you following your procedure. If we do not reach you, we will leave a message.  However, if you are feeling well and you are not experiencing any problems, there is no need to return our call.  We will assume that you have returned to your regular daily activities without incident.  If any biopsies were taken you will be contacted by phone or by letter within the next 1-3 weeks.  Please call us at [phone] if you have not heard about the biopsies in 3 weeks.    SIGNATURES/CONFIDENTIALITY: You and/or your care partner have signed paperwork which will be entered into your electronic medical record.  These signatures attest to the fact that that the information above on your After Visit Summary has been reviewed and is understood.  Full responsibility of the confidentiality of this discharge information lies with you and/or your care-partner. 

## 2015-11-14 NOTE — Progress Notes (Signed)
Patient awakening,vss,report to rn 

## 2015-11-15 ENCOUNTER — Telehealth: Payer: Self-pay

## 2015-11-15 NOTE — Telephone Encounter (Signed)
  Follow up Call-  Call back number 11/14/2015  Post procedure Call Back phone  # #[phone] hm  Permission to leave phone message Yes     Patient questions:  Do you have a fever, pain , or abdominal swelling? No. Pain Score  0 *  Have you tolerated food without any problems? Yes.    Have you been able to return to your normal activities? Yes.    Do you have any questions about your discharge instructions: Diet   No. Medications  No. Follow up visit  No.  Do you have questions or concerns about your Care? No.  Actions: * If pain score is 4 or above: No action needed, pain <4.   Pt thanked us for the wonderful care she received yesterday. maw

## 2015-12-30 ENCOUNTER — Telehealth: Payer: Self-pay

## 2015-12-30 NOTE — Telephone Encounter (Signed)
-----   Message from Chrystie NoseLinda R Hunt, RN sent at 10/02/2015  4:24 PM EST ----- Regarding: labs Needs labs in April, order in epic.

## 2015-12-30 NOTE — Telephone Encounter (Signed)
Pt aware.

## 2015-12-31 ENCOUNTER — Other Ambulatory Visit (INDEPENDENT_AMBULATORY_CARE_PROVIDER_SITE_OTHER): Payer: Medicare HMO

## 2015-12-31 DIAGNOSIS — D509 Iron deficiency anemia, unspecified: Secondary | ICD-10-CM

## 2015-12-31 LAB — IBC PANEL
IRON: 64 ug/dL (ref 42–145)
Saturation Ratios: 17.7 % — ABNORMAL LOW (ref 20.0–50.0)
TRANSFERRIN: 259 mg/dL (ref 212.0–360.0)

## 2015-12-31 LAB — CBC WITH DIFFERENTIAL/PLATELET
BASOS ABS: 0 10*3/uL (ref 0.0–0.1)
Basophils Relative: 0.6 % (ref 0.0–3.0)
Eosinophils Absolute: 0.2 10*3/uL (ref 0.0–0.7)
Eosinophils Relative: 2.7 % (ref 0.0–5.0)
HCT: 33.8 % — ABNORMAL LOW (ref 36.0–46.0)
Hemoglobin: 10.7 g/dL — ABNORMAL LOW (ref 12.0–15.0)
LYMPHS ABS: 3.1 10*3/uL (ref 0.7–4.0)
Lymphocytes Relative: 45.6 % (ref 12.0–46.0)
MCHC: 31.7 g/dL (ref 30.0–36.0)
MCV: 70.2 fl — AB (ref 78.0–100.0)
MONOS PCT: 9.7 % (ref 3.0–12.0)
Monocytes Absolute: 0.7 10*3/uL (ref 0.1–1.0)
NEUTROS ABS: 2.8 10*3/uL (ref 1.4–7.7)
NEUTROS PCT: 41.4 % — AB (ref 43.0–77.0)
PLATELETS: 343 10*3/uL (ref 150.0–400.0)
RBC: 4.81 Mil/uL (ref 3.87–5.11)
RDW: 16.7 % — ABNORMAL HIGH (ref 11.5–15.5)
WBC: 6.7 10*3/uL (ref 4.0–10.5)

## 2015-12-31 LAB — FERRITIN: FERRITIN: 107 ng/mL (ref 10.0–291.0)

## 2016-01-02 ENCOUNTER — Other Ambulatory Visit: Payer: Self-pay

## 2016-01-02 DIAGNOSIS — D509 Iron deficiency anemia, unspecified: Secondary | ICD-10-CM

## 2016-01-31 ENCOUNTER — Other Ambulatory Visit: Payer: Self-pay

## 2016-01-31 DIAGNOSIS — Z1231 Encounter for screening mammogram for malignant neoplasm of breast: Secondary | ICD-10-CM

## 2016-03-09 ENCOUNTER — Ambulatory Visit
Admission: RE | Admit: 2016-03-09 | Discharge: 2016-03-09 | Disposition: A | Discharge status: Home / Self Care | Payer: Medicare HMO | Source: Ambulatory Visit

## 2016-03-09 DIAGNOSIS — Z1231 Encounter for screening mammogram for malignant neoplasm of breast: Secondary | ICD-10-CM

## 2016-03-10 ENCOUNTER — Other Ambulatory Visit: Payer: Self-pay | Admitting: Nurse Practitioner

## 2016-03-10 DIAGNOSIS — R928 Other abnormal and inconclusive findings on diagnostic imaging of breast: Secondary | ICD-10-CM

## 2016-03-16 ENCOUNTER — Ambulatory Visit
Admission: RE | Admit: 2016-03-16 | Discharge: 2016-03-16 | Disposition: A | Discharge status: Home / Self Care | Payer: Medicare HMO | Source: Ambulatory Visit | Attending: Nurse Practitioner | Admitting: Nurse Practitioner

## 2016-03-16 DIAGNOSIS — R928 Other abnormal and inconclusive findings on diagnostic imaging of breast: Secondary | ICD-10-CM

## 2016-03-24 ENCOUNTER — Other Ambulatory Visit: Payer: Self-pay | Admitting: Orthopaedic Surgery

## 2016-03-24 DIAGNOSIS — M25511 Pain in right shoulder: Secondary | ICD-10-CM

## 2016-03-31 ENCOUNTER — Ambulatory Visit
Admission: RE | Admit: 2016-03-31 | Discharge: 2016-03-31 | Disposition: A | Discharge status: Home / Self Care | Payer: Medicare HMO | Source: Ambulatory Visit | Attending: Orthopaedic Surgery | Admitting: Orthopaedic Surgery

## 2016-03-31 DIAGNOSIS — M25511 Pain in right shoulder: Secondary | ICD-10-CM

## 2016-06-23 ENCOUNTER — Other Ambulatory Visit (INDEPENDENT_AMBULATORY_CARE_PROVIDER_SITE_OTHER): Payer: Medicare HMO

## 2016-06-23 DIAGNOSIS — D509 Iron deficiency anemia, unspecified: Secondary | ICD-10-CM | POA: Diagnosis not present

## 2016-06-23 LAB — CBC WITH DIFFERENTIAL/PLATELET
BASOS PCT: 0.6 % (ref 0.0–3.0)
Basophils Absolute: 0 10*3/uL (ref 0.0–0.1)
EOS ABS: 0.2 10*3/uL (ref 0.0–0.7)
EOS PCT: 3.3 % (ref 0.0–5.0)
HEMATOCRIT: 33.4 % — AB (ref 36.0–46.0)
Hemoglobin: 10.8 g/dL — ABNORMAL LOW (ref 12.0–15.0)
LYMPHS PCT: 48.3 % — AB (ref 12.0–46.0)
Lymphs Abs: 2.9 10*3/uL (ref 0.7–4.0)
MCHC: 32.2 g/dL (ref 30.0–36.0)
MCV: 69.3 fl — ABNORMAL LOW (ref 78.0–100.0)
MONO ABS: 0.6 10*3/uL (ref 0.1–1.0)
Monocytes Relative: 9.3 % (ref 3.0–12.0)
NEUTROS ABS: 2.3 10*3/uL (ref 1.4–7.7)
Neutrophils Relative %: 38.5 % — ABNORMAL LOW (ref 43.0–77.0)
PLATELETS: 358 10*3/uL (ref 150.0–400.0)
RBC: 4.82 Mil/uL (ref 3.87–5.11)
RDW: 16.2 % — AB (ref 11.5–15.5)
WBC: 6.1 10*3/uL (ref 4.0–10.5)

## 2016-06-23 LAB — IBC PANEL
Iron: 76 ug/dL (ref 42–145)
Saturation Ratios: 23.2 % (ref 20.0–50.0)
TRANSFERRIN: 234 mg/dL (ref 212.0–360.0)

## 2016-06-23 LAB — FERRITIN: Ferritin: 153 ng/mL (ref 10.0–291.0)

## 2016-06-25 ENCOUNTER — Other Ambulatory Visit: Payer: Self-pay

## 2016-06-25 DIAGNOSIS — D649 Anemia, unspecified: Secondary | ICD-10-CM

## 2016-07-01 ENCOUNTER — Other Ambulatory Visit: Payer: Medicare HMO

## 2016-07-09 ENCOUNTER — Other Ambulatory Visit (INDEPENDENT_AMBULATORY_CARE_PROVIDER_SITE_OTHER): Payer: Medicare HMO

## 2016-07-09 DIAGNOSIS — D649 Anemia, unspecified: Secondary | ICD-10-CM | POA: Diagnosis not present

## 2016-07-09 LAB — FECAL OCCULT BLOOD, IMMUNOCHEMICAL: FECAL OCCULT BLD: NEGATIVE

## 2016-09-03 ENCOUNTER — Telehealth (INDEPENDENT_AMBULATORY_CARE_PROVIDER_SITE_OTHER): Payer: Self-pay | Admitting: Orthopaedic Surgery

## 2016-09-03 NOTE — Telephone Encounter (Signed)
Faxed to [phone]

## 2016-09-03 NOTE — Telephone Encounter (Signed)
Destiny Page with [facility] called needing the 03/23/16 office note faxed to her. The fax# is [phone]  The ph# is [phone]

## 2017-02-12 ENCOUNTER — Other Ambulatory Visit: Payer: Self-pay | Admitting: Nurse Practitioner

## 2017-02-12 DIAGNOSIS — Z1231 Encounter for screening mammogram for malignant neoplasm of breast: Secondary | ICD-10-CM

## 2017-03-10 ENCOUNTER — Ambulatory Visit
Admission: RE | Admit: 2017-03-10 | Discharge: 2017-03-10 | Disposition: A | Discharge status: Home / Self Care | Payer: Medicare HMO | Source: Ambulatory Visit | Attending: Nurse Practitioner | Admitting: Nurse Practitioner

## 2017-03-10 DIAGNOSIS — Z1231 Encounter for screening mammogram for malignant neoplasm of breast: Secondary | ICD-10-CM

## 2017-03-11 ENCOUNTER — Other Ambulatory Visit: Payer: Self-pay | Admitting: Nurse Practitioner

## 2017-03-11 DIAGNOSIS — R928 Other abnormal and inconclusive findings on diagnostic imaging of breast: Secondary | ICD-10-CM

## 2017-03-16 ENCOUNTER — Ambulatory Visit
Admission: RE | Admit: 2017-03-16 | Discharge: 2017-03-16 | Disposition: A | Discharge status: Home / Self Care | Payer: Medicare HMO | Source: Ambulatory Visit | Attending: Nurse Practitioner | Admitting: Nurse Practitioner

## 2017-03-16 DIAGNOSIS — R928 Other abnormal and inconclusive findings on diagnostic imaging of breast: Secondary | ICD-10-CM

## 2018-03-14 ENCOUNTER — Other Ambulatory Visit: Payer: Self-pay | Admitting: Nurse Practitioner

## 2018-03-14 DIAGNOSIS — Z1231 Encounter for screening mammogram for malignant neoplasm of breast: Secondary | ICD-10-CM

## 2018-04-07 ENCOUNTER — Ambulatory Visit
Admission: RE | Admit: 2018-04-07 | Discharge: 2018-04-07 | Disposition: A | Discharge status: Home / Self Care | Payer: Medicare HMO | Source: Ambulatory Visit | Attending: Nurse Practitioner | Admitting: Nurse Practitioner

## 2018-04-07 DIAGNOSIS — Z1231 Encounter for screening mammogram for malignant neoplasm of breast: Secondary | ICD-10-CM

## 2018-06-21 ENCOUNTER — Other Ambulatory Visit: Payer: Self-pay | Admitting: Physician Assistant

## 2018-06-21 DIAGNOSIS — Z87891 Personal history of nicotine dependence: Secondary | ICD-10-CM

## 2019-03-03 ENCOUNTER — Other Ambulatory Visit: Payer: Self-pay | Admitting: Nurse Practitioner

## 2019-03-03 DIAGNOSIS — Z1231 Encounter for screening mammogram for malignant neoplasm of breast: Secondary | ICD-10-CM

## 2019-04-20 ENCOUNTER — Ambulatory Visit
Admission: RE | Admit: 2019-04-20 | Discharge: 2019-04-20 | Disposition: A | Discharge status: Home / Self Care | Payer: Medicare HMO | Source: Ambulatory Visit | Attending: Nurse Practitioner | Admitting: Nurse Practitioner

## 2019-04-20 ENCOUNTER — Other Ambulatory Visit: Payer: Self-pay

## 2019-04-20 DIAGNOSIS — Z1231 Encounter for screening mammogram for malignant neoplasm of breast: Secondary | ICD-10-CM

## 2019-05-11 ENCOUNTER — Other Ambulatory Visit: Payer: Self-pay | Admitting: Physician Assistant

## 2019-05-11 DIAGNOSIS — Z87891 Personal history of nicotine dependence: Secondary | ICD-10-CM

## 2019-05-12 ENCOUNTER — Other Ambulatory Visit: Payer: Self-pay | Admitting: Physician Assistant

## 2019-05-12 DIAGNOSIS — E2839 Other primary ovarian failure: Secondary | ICD-10-CM

## 2019-05-29 ENCOUNTER — Ambulatory Visit
Admission: RE | Admit: 2019-05-29 | Discharge: 2019-05-29 | Disposition: A | Discharge status: Home / Self Care | Payer: Medicare HMO | Source: Ambulatory Visit | Attending: Physician Assistant | Admitting: Physician Assistant

## 2019-05-29 ENCOUNTER — Other Ambulatory Visit: Payer: Self-pay

## 2019-05-29 DIAGNOSIS — E2839 Other primary ovarian failure: Secondary | ICD-10-CM

## 2020-03-13 ENCOUNTER — Other Ambulatory Visit: Payer: Self-pay | Admitting: Nurse Practitioner

## 2020-03-13 DIAGNOSIS — Z1231 Encounter for screening mammogram for malignant neoplasm of breast: Secondary | ICD-10-CM

## 2020-04-22 ENCOUNTER — Ambulatory Visit
Admission: RE | Admit: 2020-04-22 | Discharge: 2020-04-22 | Disposition: A | Discharge status: Home / Self Care | Payer: Medicare HMO | Source: Ambulatory Visit | Attending: Nurse Practitioner | Admitting: Nurse Practitioner

## 2020-04-22 ENCOUNTER — Other Ambulatory Visit: Payer: Self-pay

## 2020-04-22 DIAGNOSIS — Z1231 Encounter for screening mammogram for malignant neoplasm of breast: Secondary | ICD-10-CM

## 2021-03-18 ENCOUNTER — Other Ambulatory Visit: Payer: Self-pay | Admitting: Nurse Practitioner

## 2021-03-18 ENCOUNTER — Other Ambulatory Visit: Payer: Self-pay

## 2021-03-18 DIAGNOSIS — Z1231 Encounter for screening mammogram for malignant neoplasm of breast: Secondary | ICD-10-CM

## 2021-05-09 ENCOUNTER — Other Ambulatory Visit: Payer: Self-pay

## 2021-05-09 ENCOUNTER — Ambulatory Visit
Admission: RE | Admit: 2021-05-09 | Discharge: 2021-05-09 | Disposition: A | Discharge status: Home / Self Care | Payer: Medicare HMO | Source: Ambulatory Visit | Attending: Nurse Practitioner | Admitting: Nurse Practitioner

## 2021-05-09 DIAGNOSIS — Z1231 Encounter for screening mammogram for malignant neoplasm of breast: Secondary | ICD-10-CM

## 2021-09-27 IMAGING — MG DIGITAL SCREENING BILAT W/ TOMO W/ CAD
8 series · 8 of 24 positions shown · non-contrast
Comparison: Previous exam(s).

CLINICAL DATA: Screening.

EXAM:
DIGITAL SCREENING BILATERAL MAMMOGRAM WITH TOMO AND CAD

[R MLO synth-2D]
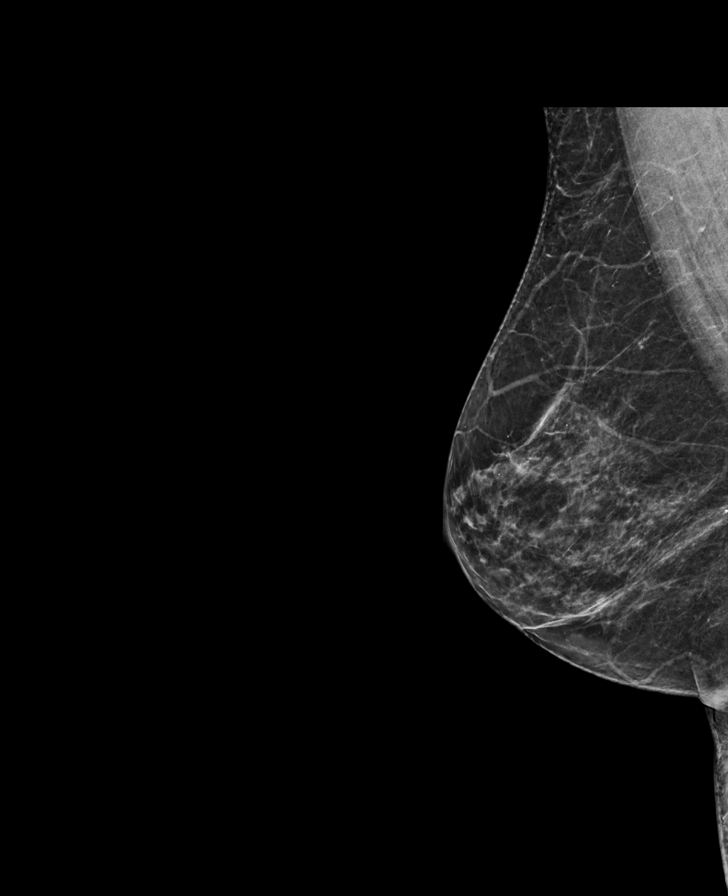

[R CC synth-2D]
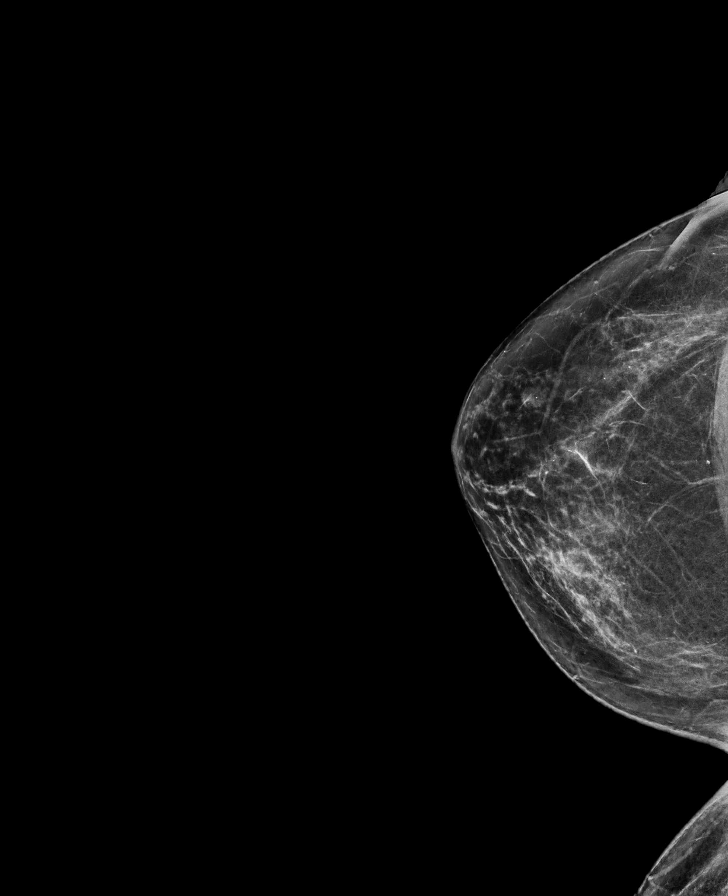

[L CC synth-2D]
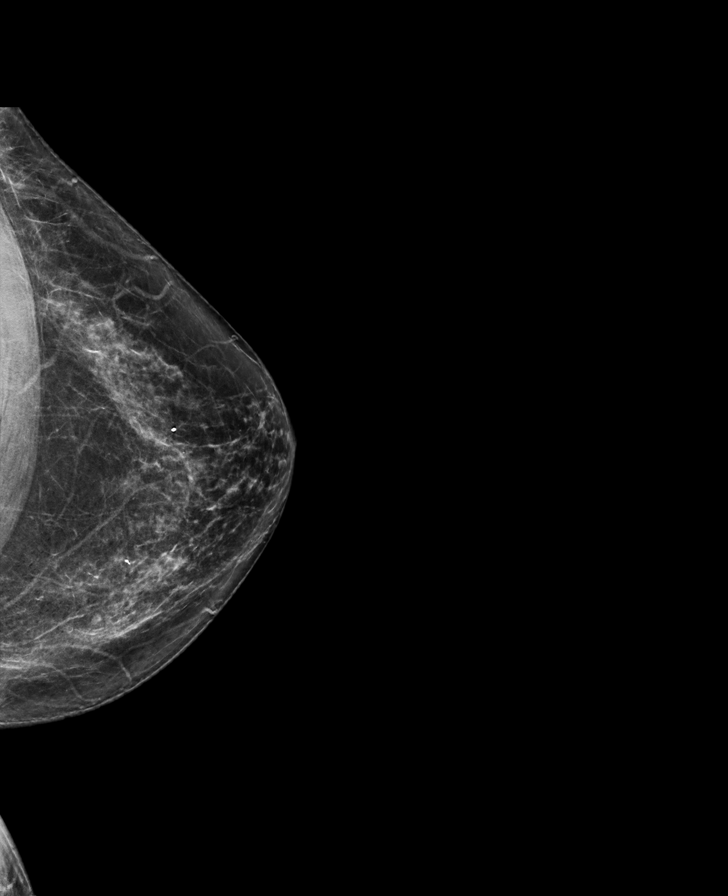

[L MLO synth-2D]
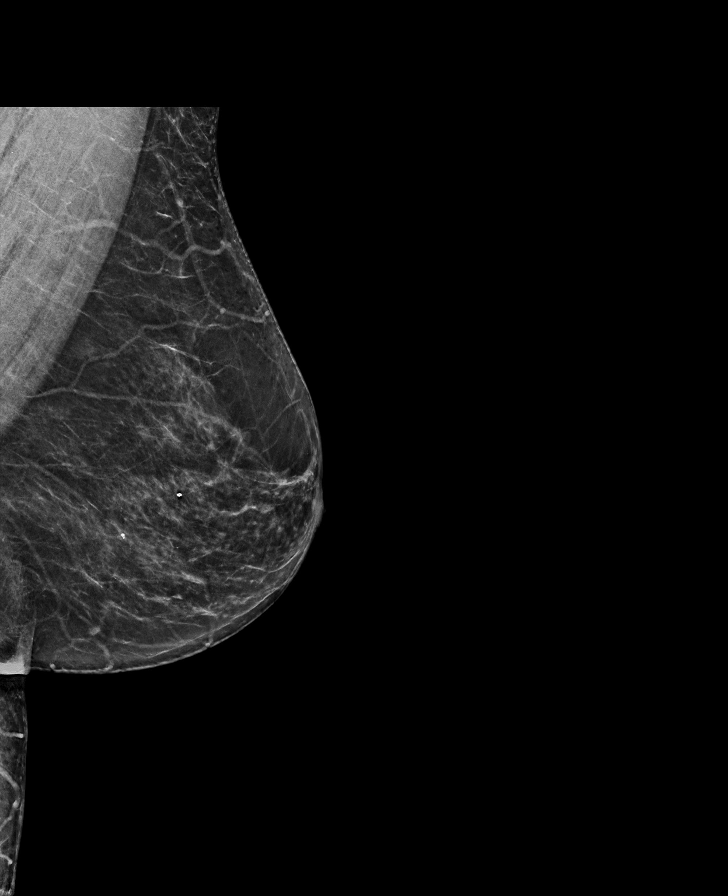

[L CC tomo · tomo slice 35/68.0]
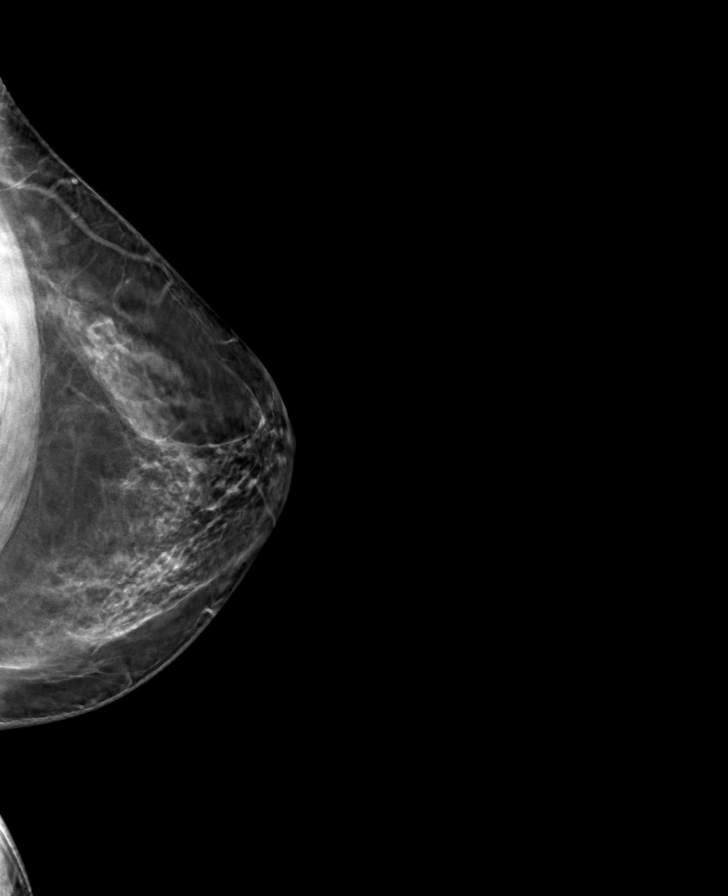

[L MLO tomo · tomo slice 32/63.0]
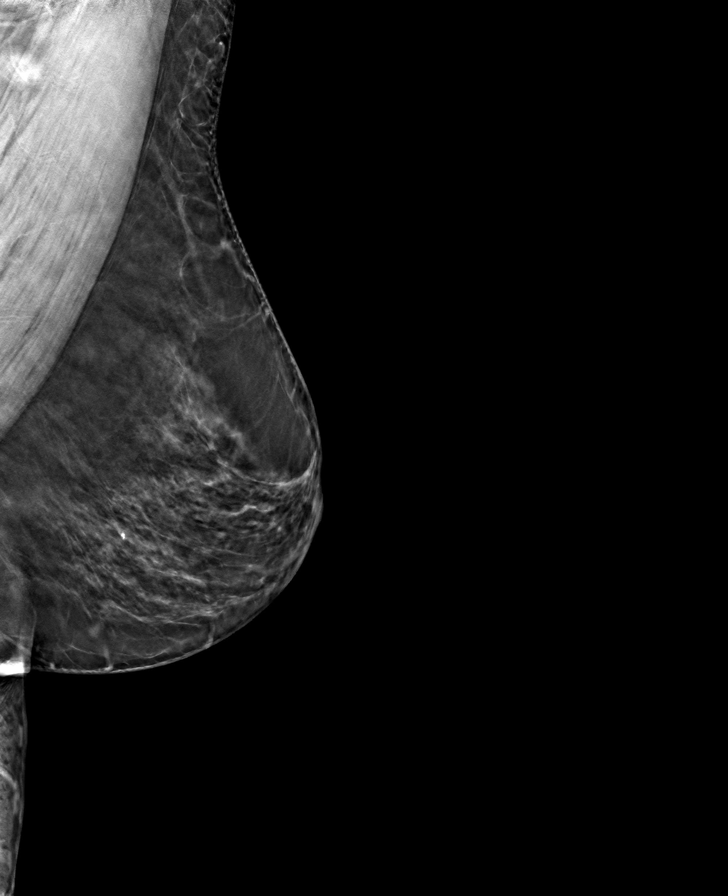

[R CC tomo · tomo slice 34/67.0]
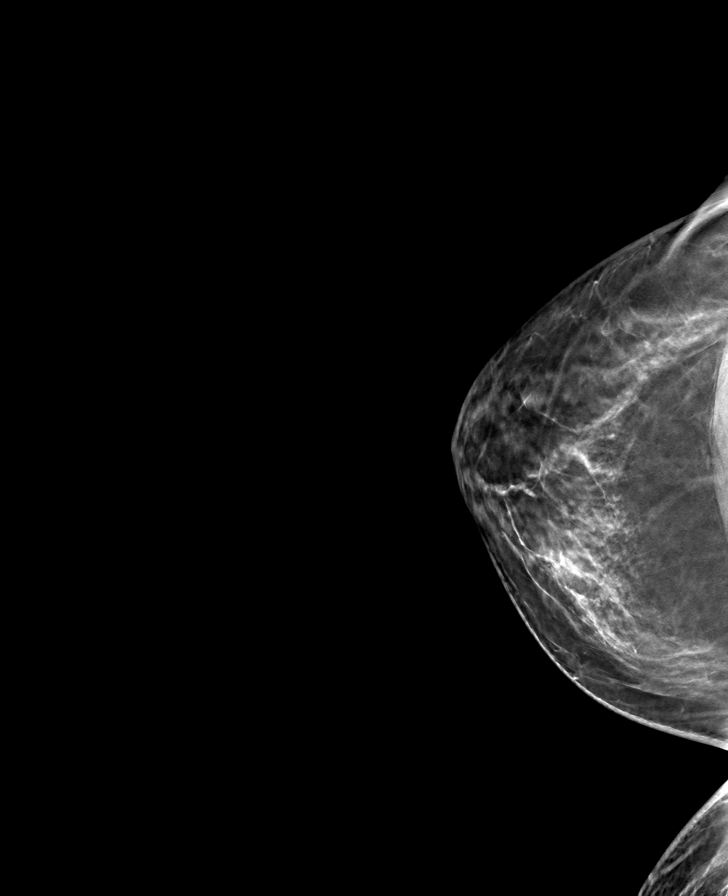

[R MLO tomo · tomo slice 31/60.0]
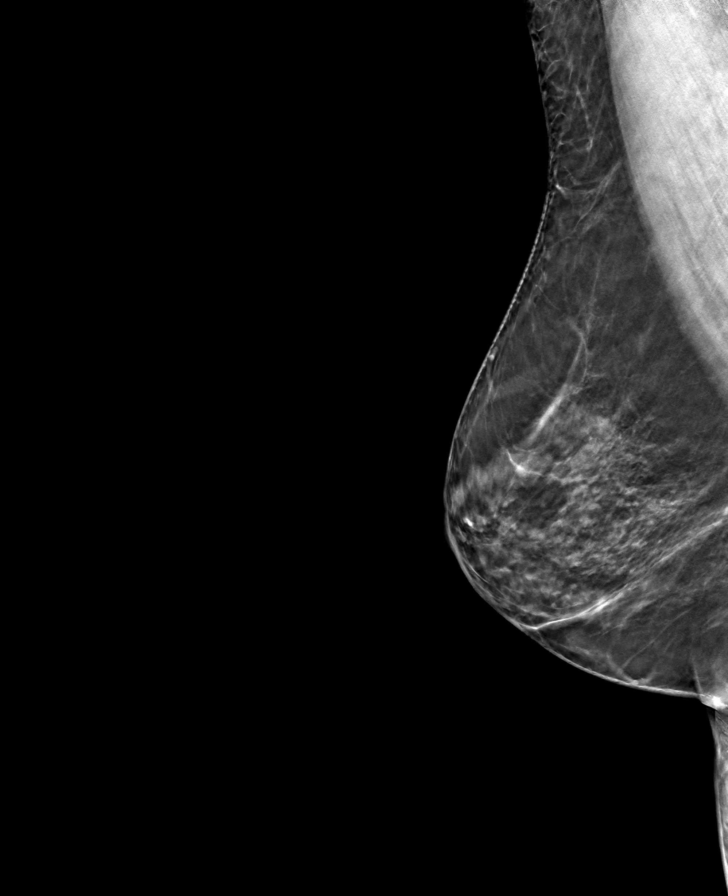

[8 of 24 positions shown; findings below may reference images not displayed]

ACR Breast Density Category b: There are scattered areas of
fibroglandular density.
FINDINGS: There are no findings suspicious for malignancy. Images were
processed with CAD.
IMPRESSION: No mammographic evidence of malignancy. A result letter of this
screening mammogram will be mailed directly to the patient.

RECOMMENDATION:
Screening mammogram in one year. (Code:[id number])

BI-RADS CATEGORY  1: Negative.

## 2022-04-02 ENCOUNTER — Other Ambulatory Visit: Payer: Self-pay | Admitting: Nurse Practitioner

## 2022-04-02 DIAGNOSIS — Z1231 Encounter for screening mammogram for malignant neoplasm of breast: Secondary | ICD-10-CM

## 2022-05-11 ENCOUNTER — Ambulatory Visit
Admission: RE | Admit: 2022-05-11 | Discharge: 2022-05-11 | Disposition: A | Discharge status: Home / Self Care | Payer: Medicare HMO | Source: Ambulatory Visit | Attending: Nurse Practitioner | Admitting: Nurse Practitioner

## 2022-05-11 DIAGNOSIS — Z1231 Encounter for screening mammogram for malignant neoplasm of breast: Secondary | ICD-10-CM

## 2023-03-24 ENCOUNTER — Other Ambulatory Visit: Payer: Self-pay | Admitting: Physician Assistant

## 2023-03-24 DIAGNOSIS — Z1231 Encounter for screening mammogram for malignant neoplasm of breast: Secondary | ICD-10-CM

## 2023-05-13 ENCOUNTER — Ambulatory Visit
Admission: RE | Admit: 2023-05-13 | Discharge: 2023-05-13 | Disposition: A | Discharge status: Home / Self Care | Payer: Medicare HMO | Source: Ambulatory Visit | Attending: Physician Assistant | Admitting: Physician Assistant

## 2023-05-13 DIAGNOSIS — Z1231 Encounter for screening mammogram for malignant neoplasm of breast: Secondary | ICD-10-CM

## 2024-04-14 ENCOUNTER — Other Ambulatory Visit: Payer: Self-pay | Admitting: Physician Assistant

## 2024-04-14 DIAGNOSIS — Z1231 Encounter for screening mammogram for malignant neoplasm of breast: Secondary | ICD-10-CM

## 2024-06-12 ENCOUNTER — Ambulatory Visit
